# Patient Record
Sex: Male | Born: 2000 | Race: White | Hispanic: No | Marital: Single | Smoking: Never smoker
Health system: Southern US, Community
[De-identification: ages and names within clinical notes are randomized; demographics above are authoritative.]

---
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
## 2022-09-16 ENCOUNTER — Other Ambulatory Visit: Payer: Self-pay | Admitting: Rheumatology

## 2022-09-16 DIAGNOSIS — M461 Sacroiliitis, not elsewhere classified: Secondary | ICD-10-CM

## 2022-09-16 DIAGNOSIS — M459 Ankylosing spondylitis of unspecified sites in spine: Secondary | ICD-10-CM

## 2022-10-04 ENCOUNTER — Other Ambulatory Visit: Payer: Self-pay

## 2022-10-12 ENCOUNTER — Other Ambulatory Visit: Payer: Self-pay
